# Patient Record
Sex: Male | Born: 1980 | Race: White | Marital: Single | Smoking: Current some day smoker
Health system: Southern US, Community
[De-identification: ages and names within clinical notes are randomized; demographics above are authoritative.]

---

## 2013-09-27 ENCOUNTER — Emergency Department (HOSPITAL_COMMUNITY): Payer: Self-pay

## 2013-09-27 ENCOUNTER — Emergency Department (HOSPITAL_COMMUNITY)
Admission: EM | Admit: 2013-09-27 | Discharge: 2013-09-27 | Disposition: A | Discharge status: Home / Self Care | Payer: Self-pay | Attending: Emergency Medicine | Admitting: Emergency Medicine

## 2013-09-27 ENCOUNTER — Encounter (HOSPITAL_COMMUNITY): Payer: Self-pay | Admitting: Emergency Medicine

## 2013-09-27 DIAGNOSIS — S0121XA Laceration without foreign body of nose, initial encounter: Secondary | ICD-10-CM

## 2013-09-27 DIAGNOSIS — Z23 Encounter for immunization: Secondary | ICD-10-CM | POA: Insufficient documentation

## 2013-09-27 DIAGNOSIS — S298XXA Other specified injuries of thorax, initial encounter: Secondary | ICD-10-CM | POA: Insufficient documentation

## 2013-09-27 DIAGNOSIS — IMO0002 Reserved for concepts with insufficient information to code with codable children: Secondary | ICD-10-CM | POA: Insufficient documentation

## 2013-09-27 DIAGNOSIS — S0990XA Unspecified injury of head, initial encounter: Secondary | ICD-10-CM | POA: Insufficient documentation

## 2013-09-27 DIAGNOSIS — R0789 Other chest pain: Secondary | ICD-10-CM

## 2013-09-27 DIAGNOSIS — S0120XA Unspecified open wound of nose, initial encounter: Secondary | ICD-10-CM | POA: Insufficient documentation

## 2013-09-27 DIAGNOSIS — F17200 Nicotine dependence, unspecified, uncomplicated: Secondary | ICD-10-CM | POA: Insufficient documentation

## 2013-09-27 MED ORDER — ACETAMINOPHEN 325 MG PO TABS
650.0000 mg | ORAL_TABLET | Freq: Once | ORAL | Status: AC
  Administered 2013-09-27: 650 mg via ORAL
  Filled 2013-09-27: qty 2

## 2013-09-27 MED ORDER — TETANUS-DIPHTH-ACELL PERTUSSIS 5-2.5-18.5 LF-MCG/0.5 IM SUSP
0.5000 mL | Freq: Once | INTRAMUSCULAR | Status: AC
  Administered 2013-09-27: 0.5 mL via INTRAMUSCULAR
  Filled 2013-09-27: qty 0.5

## 2013-09-27 MED ORDER — LIDOCAINE HCL 1 % IJ SOLN
5.0000 mL | Freq: Once | INTRAMUSCULAR | Status: AC
  Administered 2013-09-27: 5 mL
  Filled 2013-09-27: qty 20

## 2013-09-27 MED ORDER — IBUPROFEN 400 MG PO TABS: 400.0000 mg | ORAL_TABLET | Freq: Four times a day (QID) | ORAL | Status: AC | PRN

## 2013-09-27 MED ORDER — HYDROCODONE-ACETAMINOPHEN 5-325 MG PO TABS: 1.0000 | ORAL_TABLET | Freq: Four times a day (QID) | ORAL | Status: DC | PRN

## 2013-09-27 MED ORDER — PENICILLIN V POTASSIUM 250 MG PO TABS: 500.0000 mg | ORAL_TABLET | Freq: Four times a day (QID) | ORAL | Status: AC

## 2013-09-27 NOTE — ED Notes (Signed)
Pt's face cleaned w/ NS
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
## 2013-09-27 NOTE — Progress Notes (Signed)
[facility] Community Liaison Stacy, ° °Provided pt with a list of primary care resources to help patient establish a pcp.  °
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
## 2013-09-27 NOTE — ED Notes (Signed)
Pt tolerating po, ambulated well independently.

## 2013-09-27 NOTE — Discharge Instructions (Signed)
Please call your doctor for a followup appointment within 24-48 hours. When you talk to your doctor please let them know that you were seen in the emergency department and have them acquire all of your records so that they can discuss the findings with you and formulate a treatment plan to fully care for your new and ongoing problems. Please call and set-up an appointment with [facility] Please call and set-up an appointment with Orthopedics Please rest and stay hydrated  Please avoid any physical or strenuous activity Please take medications as prescribed - while on pain medications there is to be no drinking alcohol, driving, operating any heavy machinery. If extra please dispose in a proper manner. Please do not take any extra Tylenol with this medication for this can lead to Tylenol overdose and liver issues.  Please apply ice to face to aid in swelling control Please have sutures removed in no more than 5 days Please continue to monitor symptoms closely and if symptoms are to worsen or change (fever greater than 101, chills, sweating, nausea, vomiting, chest pain, shortness of breathe, difficulty breathing, weakness, numbness, tingling, worsening or changes to pain pattern, facial swelling, fall, injury, headache, dizziness, blurred vision, sudden loss of vision, neck pain, neck stiffness) please report back to the Emergency Department immediately.   Por favor llame a su mdico para una cita de seguimiento dentro de las 24-48 horas. Cuando hable con su doctor favor, hgales saber que usted fue visto en el departamento de emergencia y hacer que adquieren todos sus registros para que puedan discutir los Kimberly con usted y formular un plan de tratamiento para atender plenamente a sus nuevas y Arts administrator. Por favor llame y [place] en marcha de una cita con el Bethesda y [place] Por favor llame y puesta en marcha una cita con Ortopedia Por favor, descansar y  mantenerse hidratado Por favor, evite cualquier actividad fsica extenuante o Por favor, tome medicamentos prescritos - mientras que en medicamentos para el dolor va a haber nada de alcohol para beber, conducir, operar maquinaria pesada. Si adicional por favor disponer de Consolidated Edison. Por favor, no tome ninguna Tylenol extra con este medicamento para esto puede conducir a problemas de sobredosis de Tylenol y English as a second language teacher. Por favor, aplique hielo para hacer frente a la ayuda en el control de la inflamacin Por favor tenga suturas eliminado en no ms de 5 [address] favor, seguir vigilando los sntomas de cerca y si los sntomas son a Theme park manager o cambio (fiebre de ms de 101, escalofros, sudoracin, nuseas, vmitos, dolor en el pecho, dificultad para respirar, dificultad para respirar, debilidad, entumecimiento, hormigueo, empeoramiento o cambios en el patrn del dolor , hinchazn de la cara, cada, lesin, dolor de cabeza, mareos, visin borrosa, prdida repentina de la visin, dolor de cuello, rigidez del cuello), por favor informe al [employer] de Urgencias de inmediato.  Cuidados de Hotel manager - Adultos  (Laceration Care, Adult)  Una herida cortante es un corte o lesin que atraviesa todas las capas de la piel y el tejido que se encuentra debajo de la piel.  TRATAMIENTO  Algunas laceraciones no requieren sutura. Algunas no deben cerrarse debido a que puede aumentar el riesgo de infeccin. Es importante que consulte al mdico lo antes posible despus de recibir una lesin para minimizar el riesgo de infeccin y aumentar la posibilidad de que se cierre con xito.  Cuando se cierra adecuadamente, podrn indicarle analgsicos, si los necesita. La herida debe limpiarse  para combatir la infeccin. El mdico usar puntos (suturas), [place], o tiras Pointe a la [place]ache para Environmental consultant. Estos elementos mantendrn unidos los bordes de la piel para que se cure ms rpidamente y para un mejor resultado  cosmtico. Sin embargo, todas las heridas se curarn con una cicatriz. Una vez que la herida se haya curado, las cicatrices pueden minimizarse cubriendo la herida con pantalla solar durante el da por un lapso se 1 ao.  INSTRUCCIONES PARA EL CUIDADO EN EL HOGAR  Si tiene puntos o grapas:   Mantenga la herida limpia y [place].  Si tiene un (vendaje) cmbielo al menos una vez al da. Cmbielo si se moja o se ensucia, o segn las indicaciones del mdico.  Lave el corte dos veces por da con agua y [place]. Enjuguelo con agua para quitar todo el [place]. Seque dando palmaditas con una toalla limpia y seca.  Despus de limpiar, aplique una delgada capa de una crema con antibitico segn las indicaciones del mdico. Esto le ayudar a prevenir las infecciones y a Automotive engineer que el vendaje se Building services engineer.  Puede ducharse despus de las primeras 24 horas. No remoje la herida en agua hasta que le hayan quitado los puntos.  Solo tome medicamentos que se pueden comprar sin receta o recetados para Chief Technology Officer, Dentist o fiebre, como le indica el mdico.  Concurra para que le retiren los puntos o las grapas cuando el mdico le indique. En caso que tenga tiras ZOXWRUEAV:   Mantenga la herida limpia y seca.  No deje que las tiras se mojen. Puede darse un bao cuidando de [employer] Energy herida seca.  Si se moja, squela dando palmaditas con una toalla limpia.  Las tiras caern por s mismas. Puede recortar las tiras a medida que la herida se [place]. No quite las tiras que estn pegadas a la herida. Ellas se caern cuando sea el momento. En caso que le hayan [place].   Podr mojara momentneamente la herida en la ducha o el bao. No frote ni sumerja la herida. No practique natacin. Evite transpirar con abundancia hasta que el [place] se haya cado. Despus de ducharse o darse un bao, seque el corte dando palmaditas con una toalla limpia.  No aplique medicamentos lquidos, en crema o ungentos mientras el  QUALCOMM est en su lugar. Podr aflojarlo antes de que la herida se cure.  Si tiene un vendaje, tenga cuidado de no aplicar cinta adhesiva directamente Lehman Brothers. Esto puede hacer que el [place] se caiga antes de que la herida se haya curado.  Evite la exposicin prolongada a la luz del sol o a la International aid/development worker en QUALCOMM se Arts administrator. La exposicin a los Hydrographic surveyor la Training and development officer.  El Eastman Kodak la piel durante 5 a 10 das y Therapist, occupational. No quite la pelcula de [place]. Deber aplicarse la vacuna contra el ttanos si:  No recuerda cundo se coloc la vacuna la ltima vez.  Nunca recibi esta vacuna. Si le han aplicado la vacuna contra el ttanos, el brazo podr hincharse, enrojecer y sentirse caliente al tacto. Esto es frecuente y no es un problema. Si usted necesita aplicarse la vacuna y se niega a recibirla, corre riesgo de contraer ttanos. sta es una enfermedad grave.  SOLICITE ATENCIN MDICA SI:   Presenta enrojecimiento, hinchazn o aumento del dolor en la herida.  Hay rayas rojas que salen de la herida.  Observa un lquido blanco amarillento (  pus) en la herida.  Tiene fiebre.  Advierte un olor ftido que proviene de la herida o del vendaje.  La herida se abre luego de que le han extrado las suturas.  Nota que en la herida hay algn cuerpo extrao como un trozo de [place] o vidrio.  La herida est en su mano o pie y observa que no puede mover correctamente los dedos. SOLICITE ATENCIN MDICA DE INMEDIATO SI:   El dolor no se alivia con los [employer] Parcel.  Hay una zona muy hinchada alrededor de la herida que le causa dolor y adormecimiento, o advierte un cambio en el color en el brazo, la mano, la pierna o el pie.  La herida se abre y sangra nuevamente.  Siente que el adormecimiento, la debilidad o la prdida de la funcin de la articulacin que rodea la herida  Mantua.  Palpa ndulos dolorosos cerca de la herida o bajo la piel en cualquier zona del cuerpo. ASEGRESE DE QUE:   Comprende estas instrucciones.  Controlar su enfermedad.  Solicitar ayuda de inmediato si no mejora o si empeora. Document Released: 01/03/2005 Document Revised: 03/28/2011 [facility] Patient Information 2015 [place], [place]. This information is not intended to replace advice given to you by your health care provider. Make sure you discuss any questions you have with your health care provider.  Dolor de la pared torcica (Chest Wall Pain) Dolor en la pared torcica es dolor en o alrededor de los huesos y msculos de su pecho. Podrn pasar hasta 6 semanas hasta que comience a mejorar. Puede demorar ms tiempo si es fsicamente activo en su Aleen Campi y [place].  CAUSAS  El dolor en el pecho puede aparecer sin motivo. No obstante, algunas causas pueden ser:   Neomia Dear enfermedad viral como la gripe.  Traumatismos.  Tos.  La prctica de ejercicios.  Artritis.  Fibromialgia  Culebrilla. INSTRUCCIONES PARA EL CUIDADO DOMICILIARIO  Evite hacer actividad fsica extenuante. Trate de no esforzarse o Electrical engineer. Aqu se incluyen las actividades en las que [place] los msculos del trax, los abdominales y los msculos laterales, especialmente si debe levantar objetos pesados.  Aplique hielo sobre la zona dolorida.  Ponga el hielo en una bolsa plstica.  Colquese una toalla entre la piel y la bolsa de hielo.  Deje la bolsa de hielo durante 15 a 20 minutos por hora, durante los primeros 2 [address].  Utilice los medicamentos de venta libre o de prescripcin para Chief Technology Officer, Environmental health practitioner o la [place], segn se lo indique el profesional que lo asiste. SOLICITE ATENCIN MDICA DE INMEDIATO SI:  El dolor aumenta o siente muchas molestias.  Tiene fiebre.  El dolor de [place].  Desarrolla nuevos e inexplicables sntomas.  Tiene nuseas o  vmitos.  Berenice Primas o se siente mareado.  Tiene tos con flema (esputo), o tose con sangre. EST SEGURO QUE:   Comprende las instrucciones para el alta mdica.  Controlar su enfermedad.  Solicitar atencin mdica de inmediato segn las indicaciones. Document Released: 02/14/2006 Document Revised: 03/28/2011 [facility] Patient Information 2015 [place], [place]. This information is not intended to replace advice given to you by your health care provider. Make sure you discuss any questions you have with your health care provider.   Emergency Department Resource Guide 1) Find a Doctor and Pay Out of Pocket Although you won't have to find out who is covered by your insurance plan, it is a good idea to ask around and get recommendations. You will then need to  call the office and see if the doctor you have chosen will accept you as a new patient and what types of options they offer for patients who are self-pay. Some doctors offer discounts or will set up payment plans for their patients who do not have insurance, but you will need to ask so you aren't surprised when you get to your appointment.  2) Contact Your Local Health Department Not all health departments have doctors that can see patients for sick visits, but many do, so it is worth a call to see if yours does. If you don't know where your local health department is, you can check in your phone book. The CDC also has a tool to help you locate your state's health department, and many state websites also have listings of all of their local health departments.  3) Find a Walk-in Clinic If your illness is not likely to be very severe or complicated, you may want to try a walk in clinic. These are popping up all over the country in pharmacies, drugstores, and shopping centers. They're usually staffed by nurse practitioners or physician assistants that have been trained to treat common illnesses and complaints. They're usually fairly quick and  inexpensive. However, if you have serious medical issues or chronic medical problems, these are probably not your best option.  No Primary Care Doctor: - Call Health Connect at  [phone] - they can help you locate a primary care doctor that  accepts your insurance, provides certain services, etc. - Physician Referral Service- [phone]  Chronic Pain Problems: Organization         Address  Phone   Notes  [facility]  [phone] Patients need to be referred by their primary care doctor.   Medication Assistance: Organization         Address  Phone   Notes  [facility] Medication [facility] [address] [place] [place], [place] [address] [phone] --Must be a resident of [facility] -- Must have NO insurance coverage whatsoever (no Medicaid/ Medicare, etc.) -- The pt. MUST have a primary care doctor that directs their care regularly and follows them in the community   MedAssist  [phone]   [employer]  [phone]    Agencies that provide inexpensive medical care: Organization         Address  Phone   Notes  [facility]  [phone]   [facility]    [phone]   [facility] [address] [place], [place] [address] [phone]   [facility] [address] [place] [place] [phone]   [facility]anned Parenthood    [phone]   [facility]    [phone]   [facility]  [address], [place] Phone:  [phone], Fax:  [phone] Hours of Operation:  9 am - 6 pm, M-F.  Also accepts Medicaid/Medicare and self-pay.  [facility]  [address], [place] Phone: [phone], Fax: [phone]. Hours of Operation:  8:30 am - 5:30 pm, M-F.  Also accepts Medicaid and self-pay.  [facility] [place] [address] [place] Phone: [phone]   [facility] [address] Natasha Bence [place], [place] [phone], Ext. [phone] Mondays & Thursdays: 7-9 AM.  First 15 patients are seen on a first come, first serve basis.  Medicaid-accepting [facility] Providers:  Organization         Address  Phone   Notes  [facility] [address], [place] [phone] Also accepts self-pay patients.  [facility] [address] Laurell Josephs [place], [place]  [phone]   [facility] [address], [place] [phone]   [facility] [address], [place] [phone]   Renaye Rakers [address] [place], [place]   [phone] Only accepts [place] Access [place] patients after they have their name applied to their card.   Self-Pay (no insurance) in [facility]:  Organization         Address  Phone   Notes  Sickle Cell Patients, [facility] [address] [place], [place] [phone]   [facility] Care [address] [place], [place] [phone]   [facility] [place]  [address], [place] [phone]   [facility]/Dr. Osei-Bonsu  [address], [place] or [address], [place] [phone] Phone number for both [place] and [place] locations is the same.  [facility] and [facility] [address] [place] [phone]   [facility] [address] [place] or [address] [phone]   [facility] [address] [place] [phone], phone; [phone], fax Sees patients 1st and 3rd Saturday of every month.  Must not qualify for public or private insurance (i.e. Medicaid, Medicare, [place]vay Health Choice, Veterans' Benefits)  Household income should be no more than 200% of the poverty level The clinic cannot treat you if you are pregnant or  think you are pregnant  Sexually transmitted diseases are not treated at the clinic.    Dental Care: Organization         Address  Phone  Notes  [facility] [address] [place], [place] [phone] Accepts children up to age 68 who are enrolled in [place] or [place] Health Choice; pregnant women with a Medicaid card; and children who have applied for Medicaid or [place] Health Choice, but were declined, whose parents can pay a reduced fee at time of service.  [facility] Department of [facility]  [address], [place] [phone] Accepts children up to age 85 who are enrolled in [place] or  Health Choice; pregnant women with a Medicaid card; and children who have applied for Medicaid or  Health Choice, but were declined, whose parents can pay a reduced fee at time of service.  [facility]  [address] [place], [place] [phone] Patients are seen by appointment only. Walk-ins are not accepted. [facility] will see patients 57 years of age and older. Monday - Tuesday (8am-5pm) Most Wednesdays (8:30-5pm) $30 per visit, cash only  [facility] Adult Dental Access [facility]  [address], [facility] [phone] Patients are seen by appointment only. Walk-ins are not accepted. [facility] will see patients 59 years of age and older. One Wednesday Evening (Monthly: Volunteer Based).  $30 per visit, cash only  [employer]  [phone] for adults; Children under age 29, call Graduate Pediatric Dentistry at [phone]. Children aged 70-14, please call [phone] to request a pediatric  application.  Dental services are provided in all areas of dental care including fillings, crowns and bridges, complete and partial dentures, implants, gum treatment, root canals, and extractions. Preventive care is also provided. Treatment is provided to both adults  and children. Patients are selected via a lottery and there is often a waiting list.   [facility] [address] [place]  [phone] [email or web address]   [facility] [address] [place], [place] [phone], Ext. [phone] Second and Fourth Thursday of each month, opens at 6:30 AM; Clinic ends at 9 AM.  Patients are seen on a first-come first-served basis, and a limited number are seen during each clinic.   [facility]  [address] Ether Griffins [place], [place] [phone]   Eligibility Requirements You must have lived in [place], [place], or [place] counties for at least the last three months.   You cannot be eligible for state or federal sponsored [place], including [employer], [place], or [employer].   You generally cannot be eligible for healthcare insurance through your employer.    How to apply: Eligibility screenings are held every Tuesday and Wednesday afternoon from 1:00 pm until 4:00 pm. You do not need an appointment for the interview!  [facility] [address], [place], [place] [phone]   [facility] Health Department  [phone]   [facility] Health Department  [phone]   [facility] Health Department  [phone]    Behavioral Health Resources in the Community: Intensive Outpatient Programs Organization         Address  Phone  Notes  [facility] [address], [place], [place] [phone]   [facility] Outpatient [address] [place], [place] [phone]   [facility] [address] [place], [place]  [phone]   [facility] Mental Health [address],  [place], [place] [phone] or [phone]   Substance Abuse Resources Organization         Address  Phone  Notes  [facility] Drug [facility]  [phone]   [facility]  [phone]   [facility] [place]  [phone]    Floydene Flock  [phone]   Residential & Outpatient Substance Abuse Program  [phone]   Psychological Services Organization         Address  Phone  Notes  [facility]  [phone]   [facility] Services  [phone]   [facility] Mental Health [address], [place] [phone] or [phone]    Mobile Crisis Teams Organization         Address  Phone  Notes  [facility], Mobile Crisis Care Unit  [phone]   [facility]  [address] [place], [place] [phone]   Doristine Locks [address] [place] [phone]    Self-Help/Support Groups Organization         Address  Phone             Notes  [facility] [place] - variety of support groups  [phone]- I7437963 Call for more information  Narcotics Anonymous (NA), [facility] [address], [employer] [place]  2 meetings at this location   Statistician         Address  Phone  Notes  [facility] [address],    [place]  [phone]   [facility]  [address], [place] [id number],  [place], [place] [phone]   [facility] Residential Treatment Facility [address] [place], [place] [phone] Admissions: 8am-3pm M-F  [facility] [address]    [place], [place] [phone]   [facility] [address] [place], [place], [place] [phone]   [facility] [address],  [place], [place] [phone]   [facility] - Intensive Outpatient [address] Laurell Josephs [address], [place], [place] [phone]   [facility] ([facility]iction [facility] [address] [place] [address]  [place], [place] [phone] or [phone]   [facility] ([facility]) [address], [place], [place] [phone] Accepts Medicaid  [facility] [address],  [place] [phone] Substance Abuse/Addiction Treatment   [facility] Organization         Address  Phone  Notes  [facility]  [phone]   Angie Fava, PhD [address] Ervin Knack [place], [place]   [phone] or [phone]   [facility] Behavioral   [address] [place], [place] [phone]   [facility] [address] [place], [place] [phone] Insurance/Medicaid/sponsorship through [facility] and Families [address]                                    [place], [place] [phone] Therapy/tele-psych/case  [facility] [address], [place] [phone]    Dr. Lolly Mustache  [phone]   [facility]  [employer] [facility] Dept. 1) [address] [place] 2) [address], [place] 3)  [address], [place] [phone]  [phone]   [facility] Child Abuse Hotline [phone]) [phone] or [phone] (After Hours)

## 2013-10-02 ENCOUNTER — Emergency Department (HOSPITAL_COMMUNITY)
Admission: EM | Admit: 2013-10-02 | Discharge: 2013-10-03 | Disposition: A | Discharge status: Home / Self Care | Payer: Self-pay | Attending: Emergency Medicine | Admitting: Emergency Medicine

## 2013-10-02 ENCOUNTER — Encounter (HOSPITAL_COMMUNITY): Payer: Self-pay | Admitting: Emergency Medicine

## 2013-10-02 DIAGNOSIS — Z792 Long term (current) use of antibiotics: Secondary | ICD-10-CM | POA: Insufficient documentation

## 2013-10-02 DIAGNOSIS — Z4802 Encounter for removal of sutures: Secondary | ICD-10-CM | POA: Insufficient documentation

## 2013-10-02 DIAGNOSIS — F17200 Nicotine dependence, unspecified, uncomplicated: Secondary | ICD-10-CM | POA: Insufficient documentation

## 2013-10-02 NOTE — ED Notes (Signed)
Bed: WTR5 Expected date:  Expected time:  Means of arrival:  Comments: 
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
## 2013-10-02 NOTE — Discharge Instructions (Signed)
Remoción de la sutura, cuidados posteriores °(Suture Removal, Care After) °Siga estas instrucciones durante las próximas semanas. Estas indicaciones le proporcionan información general acerca de cómo deberá cuidarse después del procedimiento. El médico también podrá darle instrucciones más específicas. El tratamiento se ha planificado de acuerdo a las prácticas médicas actuales, pero a veces se producen problemas. Comuníquese con el médico si tiene algún problema o tiene dudas después del procedimiento. °QUÉ ESPERAR DESPUÉS DEL PROCEDIMIENTO °Después de que le retiren los puntos (suturas), es normal experimentar lo siguiente: °· Molestias e hinchazón en la zona de la herida. °· Leve enrojecimiento en la zona de la herida. °INSTRUCCIONES PARA EL CUIDADO EN EL HOGAR  °· Si tiene bandas adhesivas en la piel sobre la zona de la herida, no las retire. Se caerán solas en unos pocos días. Si las bandas adhesivas siguen en su lugar después de 14 días, entonces puede retirarlas. °· Cambie los apósitos (vendajes), al menos, una vez al día o según las instrucciones del médico. Si el vendaje se adhiere, remójelo con agua jabonosa tibia. °· Solo aplique un ungüento o crema según las indicaciones del médico. Si usa crema o ungüento, lave la zona con agua y jabón dos veces por día para quitarlo todo. Enjuague el jabón y seque suavemente la zona con una toalla limpia. °· Mantenga la herida limpia y seca. Si el vendaje se moja, se ensucia o tiene mal olor, cámbielo tan pronto como pueda. °· Continúe protegiendo la herida de lesiones. °· Use protector solar cuando salga al sol. Las cicatrices nuevas se broncean fácilmente. °SOLICITE ATENCIÓN MÉDICA SI: °· Aumenta el enrojecimiento, la hinchazón o el dolor en la zona afectada. °· Observa que sale pus de la herida. °· Tiene fiebre. °· Advierte un olor fétido que proviene de la herida o del vendaje. °· La herida se abre (los bordes se separan). °Document Released: 10/13/2004 Document  Revised: 10/24/2012 °ExitCare® Patient Information ©2015 [employer]. This information is not intended to replace advice given to you by your health care provider. Make sure you discuss any questions you have with your health care provider. ° °

## 2013-10-02 NOTE — ED Notes (Signed)
Pt here for suture removal on bridge of his nose.  Wound appears to be healing well.  No drainage noted at this time.

## 2013-10-03 NOTE — ED Provider Notes (Signed)
Medical screening examination/treatment/procedure(s) were performed by non-physician practitioner and as supervising physician I was immediately available for consultation/collaboration.   EKG Interpretation None       Pricila Bridge K Renley Banwart-Rasch, MD 10/03/13 1610

## 2013-10-03 NOTE — ED Notes (Signed)
See triage note.

## 2013-10-04 NOTE — ED Provider Notes (Signed)
Medical screening examination/treatment/procedure(s) were performed by non-physician practitioner and as supervising physician I was immediately available for consultation/collaboration.   EKG Interpretation None        Candyce Churn III, MD 10/04/13 1525
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
## 2022-01-16 ENCOUNTER — Emergency Department (HOSPITAL_COMMUNITY): Payer: Self-pay

## 2022-01-16 ENCOUNTER — Other Ambulatory Visit: Payer: Self-pay

## 2022-01-16 ENCOUNTER — Encounter (HOSPITAL_COMMUNITY): Payer: Self-pay | Admitting: *Deleted

## 2022-01-16 ENCOUNTER — Emergency Department (HOSPITAL_COMMUNITY)
Admission: EM | Admit: 2022-01-16 | Discharge: 2022-01-16 | Disposition: A | Discharge status: Home / Self Care | Payer: Self-pay | Attending: Emergency Medicine | Admitting: Emergency Medicine

## 2022-01-16 DIAGNOSIS — S82892A Other fracture of left lower leg, initial encounter for closed fracture: Secondary | ICD-10-CM

## 2022-01-16 DIAGNOSIS — Y9372 Activity, wrestling: Secondary | ICD-10-CM | POA: Insufficient documentation

## 2022-01-16 DIAGNOSIS — S82852A Displaced trimalleolar fracture of left lower leg, initial encounter for closed fracture: Secondary | ICD-10-CM | POA: Insufficient documentation

## 2022-01-16 MED ORDER — PROPOFOL 10 MG/ML IV BOLUS
1.0000 mg/kg | Freq: Once | INTRAVENOUS | Status: DC
  Filled 2022-01-16: qty 20

## 2022-01-16 MED ORDER — LACTATED RINGERS IV BOLUS
1000.0000 mL | Freq: Once | INTRAVENOUS | Status: AC
  Administered 2022-01-16: 1000 mL via INTRAVENOUS

## 2022-01-16 MED ORDER — LIDOCAINE-EPINEPHRINE (PF) 2 %-1:200000 IJ SOLN
20.0000 mL | Freq: Once | INTRAMUSCULAR | Status: AC
  Administered 2022-01-16: 20 mL via INTRADERMAL
  Filled 2022-01-16: qty 20

## 2022-01-16 MED ORDER — OXYCODONE-ACETAMINOPHEN 5-325 MG PO TABS
1.0000 | ORAL_TABLET | Freq: Once | ORAL | Status: AC
  Administered 2022-01-16: 1 via ORAL
  Filled 2022-01-16: qty 1

## 2022-01-16 MED ORDER — OXYCODONE HCL 5 MG PO TABS
5.0000 mg | ORAL_TABLET | Freq: Once | ORAL | Status: AC
  Administered 2022-01-16: 5 mg via ORAL
  Filled 2022-01-16: qty 1

## 2022-01-16 MED ORDER — ONDANSETRON HCL 4 MG/2ML IJ SOLN
4.0000 mg | Freq: Once | INTRAMUSCULAR | Status: AC
  Administered 2022-01-16: 4 mg via INTRAVENOUS
  Filled 2022-01-16: qty 2

## 2022-01-16 MED ORDER — PROPOFOL 10 MG/ML IV BOLUS
INTRAVENOUS | Status: AC | PRN
  Administered 2022-01-16: 30 mg via INTRAVENOUS

## 2022-01-16 MED ORDER — FENTANYL CITRATE PF 50 MCG/ML IJ SOSY
100.0000 ug | PREFILLED_SYRINGE | Freq: Once | INTRAMUSCULAR | Status: AC
  Administered 2022-01-16: 100 ug via INTRAVENOUS
  Filled 2022-01-16: qty 2
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
## 2022-01-24 ENCOUNTER — Ambulatory Visit: Payer: Self-pay | Admitting: Student

## 2022-01-24 DIAGNOSIS — S82892A Other fracture of left lower leg, initial encounter for closed fracture: Secondary | ICD-10-CM

## 2022-01-25 ENCOUNTER — Other Ambulatory Visit: Payer: Self-pay

## 2022-01-25 ENCOUNTER — Encounter (HOSPITAL_COMMUNITY): Payer: Self-pay | Admitting: Student

## 2022-01-25 NOTE — Progress Notes (Signed)
SDW CALL  Patient was given pre-op instructions over the phone. Patient verbalized understanding of instructions provided. Administrator, sports with [employer], [phone], was used during the phone call   PCP - No PCP Cardiologist - denies  Chest x-ray - n/a EKG - n/a Stress Test - denies  ECHO -  denies Cardiac Cath - denies  Blood Thinner Instructions: No medicines Aspirin Instructions:  ERAS Protcol - NPO PRE-SURGERY Ensure or G2-   COVID TEST- no   Anesthesia review: no  Patient denies shortness of breath, fever, cough and chest pain over the phone call
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
## 2022-01-26 ENCOUNTER — Other Ambulatory Visit: Payer: Self-pay

## 2022-01-26 ENCOUNTER — Ambulatory Visit (HOSPITAL_COMMUNITY): Payer: Self-pay | Admitting: Certified Registered Nurse Anesthetist

## 2022-01-26 ENCOUNTER — Ambulatory Visit (HOSPITAL_BASED_OUTPATIENT_CLINIC_OR_DEPARTMENT_OTHER): Payer: Self-pay | Admitting: Certified Registered Nurse Anesthetist

## 2022-01-26 ENCOUNTER — Ambulatory Visit (HOSPITAL_COMMUNITY): Payer: Self-pay

## 2022-01-26 ENCOUNTER — Ambulatory Visit (HOSPITAL_COMMUNITY)
Admission: RE | Admit: 2022-01-26 | Discharge: 2022-01-26 | Disposition: A | Discharge status: Home / Self Care | Payer: Self-pay | Attending: Student | Admitting: Student

## 2022-01-26 ENCOUNTER — Encounter (HOSPITAL_COMMUNITY)
Admission: RE | Disposition: A | Discharge status: Home / Self Care | Payer: Self-pay | Source: Home / Self Care | Attending: Student

## 2022-01-26 ENCOUNTER — Encounter (HOSPITAL_COMMUNITY): Payer: Self-pay | Admitting: Student

## 2022-01-26 DIAGNOSIS — F17210 Nicotine dependence, cigarettes, uncomplicated: Secondary | ICD-10-CM | POA: Insufficient documentation

## 2022-01-26 DIAGNOSIS — S82892A Other fracture of left lower leg, initial encounter for closed fracture: Secondary | ICD-10-CM

## 2022-01-26 DIAGNOSIS — S82852D Displaced trimalleolar fracture of left lower leg, subsequent encounter for closed fracture with routine healing: Secondary | ICD-10-CM | POA: Insufficient documentation

## 2022-01-26 DIAGNOSIS — S82892D Other fracture of left lower leg, subsequent encounter for closed fracture with routine healing: Secondary | ICD-10-CM

## 2022-01-26 HISTORY — PX: ORIF ANKLE FRACTURE: SHX5408

## 2022-01-26 LAB — RAPID URINE DRUG SCREEN, HOSP PERFORMED
Amphetamines: NOT DETECTED
Barbiturates: NOT DETECTED
Benzodiazepines: NOT DETECTED
Cocaine: NOT DETECTED
Opiates: NOT DETECTED
Tetrahydrocannabinol: NOT DETECTED

## 2022-01-26 LAB — SURGICAL PCR SCREEN
MRSA, PCR: NEGATIVE
Staphylococcus aureus: POSITIVE — AB

## 2022-01-26 LAB — CBC WITH DIFFERENTIAL/PLATELET
Abs Immature Granulocytes: 0.02 K/uL (ref 0.00–0.07)
Basophils Absolute: 0 K/uL (ref 0.0–0.1)
Basophils Relative: 0 %
Eosinophils Absolute: 0.1 K/uL (ref 0.0–0.5)
Eosinophils Relative: 1 %
HCT: 37.3 % — ABNORMAL LOW (ref 39.0–52.0)
Hemoglobin: 13 g/dL (ref 13.0–17.0)
Immature Granulocytes: 0 %
Lymphocytes Relative: 25 %
Lymphs Abs: 1.6 K/uL (ref 0.7–4.0)
MCH: 33.1 pg (ref 26.0–34.0)
MCHC: 34.9 g/dL (ref 30.0–36.0)
MCV: 94.9 fL (ref 80.0–100.0)
Monocytes Absolute: 0.7 K/uL (ref 0.1–1.0)
Monocytes Relative: 11 %
Neutro Abs: 4.2 K/uL (ref 1.7–7.7)
Neutrophils Relative %: 63 %
Platelets: 399 K/uL (ref 150–400)
RBC: 3.93 MIL/uL — ABNORMAL LOW (ref 4.22–5.81)
RDW: 12.4 % (ref 11.5–15.5)
WBC: 6.7 K/uL (ref 4.0–10.5)
nRBC: 0 % (ref 0.0–0.2)

## 2022-01-26 LAB — HEMOGLOBIN A1C
Hgb A1c MFr Bld: 4.9 % (ref 4.8–5.6)
Mean Plasma Glucose: 93.93 mg/dL

## 2022-01-26 LAB — BASIC METABOLIC PANEL
Anion gap: 9 (ref 5–15)
BUN: 13 mg/dL (ref 6–20)
CO2: 24 mmol/L (ref 22–32)
Calcium: 9 mg/dL (ref 8.9–10.3)
Chloride: 102 mmol/L (ref 98–111)
Creatinine, Ser: 0.72 mg/dL (ref 0.61–1.24)
GFR, Estimated: >60 mL/min (ref >60)
Glucose, Bld: 85 mg/dL (ref 70–99)
Potassium: 3.5 mmol/L (ref 3.5–5.1)
Sodium: 135 mmol/L (ref 135–145)

## 2022-01-26 SURGERY — OPEN REDUCTION INTERNAL FIXATION (ORIF) ANKLE FRACTURE
Anesthesia: Regional | Site: Ankle | Laterality: Left

## 2022-01-26 MED ORDER — CLONIDINE HCL (ANALGESIA) 100 MCG/ML EP SOLN
EPIDURAL | Status: DC | PRN
  Administered 2022-01-26: 100 ug

## 2022-01-26 MED ORDER — BUPIVACAINE-EPINEPHRINE (PF) 0.5% -1:200000 IJ SOLN
INTRAMUSCULAR | Status: DC | PRN
  Administered 2022-01-26: 25 mL via PERINEURAL

## 2022-01-26 MED ORDER — MIDAZOLAM HCL 5 MG/5ML IJ SOLN
INTRAMUSCULAR | Status: DC | PRN
  Administered 2022-01-26 (×3): 1 mg via INTRAVENOUS

## 2022-01-26 MED ORDER — PROPOFOL 10 MG/ML IV BOLUS
INTRAVENOUS | Status: DC | PRN
  Administered 2022-01-26: 100 mg via INTRAVENOUS
  Administered 2022-01-26: 20 mg via INTRAVENOUS

## 2022-01-26 MED ORDER — FENTANYL CITRATE (PF) 250 MCG/5ML IJ SOLN
INTRAMUSCULAR | Status: DC | PRN
  Administered 2022-01-26: 25 ug via INTRAVENOUS

## 2022-01-26 MED ORDER — PROPOFOL 10 MG/ML IV BOLUS
INTRAVENOUS | Status: AC
  Filled 2022-01-26: qty 20

## 2022-01-26 MED ORDER — GABAPENTIN 100 MG PO CAPS: 100.0000 mg | ORAL_CAPSULE | Freq: Three times a day (TID) | ORAL | 0 refills | Status: AC

## 2022-01-26 MED ORDER — OXYCODONE-ACETAMINOPHEN 5-325 MG PO TABS: 1.0000 | ORAL_TABLET | ORAL | 0 refills | Status: AC | PRN

## 2022-01-26 MED ORDER — OXYCODONE HCL 5 MG/5ML PO SOLN: 5.0000 mg | Freq: Once | ORAL | Status: DC | PRN

## 2022-01-26 MED ORDER — MIDAZOLAM HCL 2 MG/2ML IJ SOLN
INTRAMUSCULAR | Status: AC
  Filled 2022-01-26: qty 2

## 2022-01-26 MED ORDER — ROPIVACAINE HCL 7.5 MG/ML IJ SOLN
INTRAMUSCULAR | Status: DC | PRN
  Administered 2022-01-26: 10 mL via PERINEURAL

## 2022-01-26 MED ORDER — ASPIRIN 325 MG PO TBEC: 325.0000 mg | DELAYED_RELEASE_TABLET | Freq: Every day | ORAL | 0 refills | Status: AC

## 2022-01-26 MED ORDER — ONDANSETRON HCL 4 MG/2ML IJ SOLN
INTRAMUSCULAR | Status: DC | PRN
  Administered 2022-01-26: 4 mg via INTRAVENOUS

## 2022-01-26 MED ORDER — VANCOMYCIN HCL 1000 MG IV SOLR
INTRAVENOUS | Status: AC
  Filled 2022-01-26: qty 20

## 2022-01-26 MED ORDER — 0.9 % SODIUM CHLORIDE (POUR BTL) OPTIME
TOPICAL | Status: DC | PRN
  Administered 2022-01-26: 1000 mL

## 2022-01-26 MED ORDER — CHLORHEXIDINE GLUCONATE 0.12 % MT SOLN
OROMUCOSAL | Status: AC
  Administered 2022-01-26: 15 mL via OROMUCOSAL
  Filled 2022-01-26: qty 15

## 2022-01-26 MED ORDER — OXYCODONE HCL 5 MG PO TABS: 5.0000 mg | ORAL_TABLET | Freq: Once | ORAL | Status: DC | PRN

## 2022-01-26 MED ORDER — ACETAMINOPHEN 160 MG/5ML PO SOLN: 1000.0000 mg | Freq: Once | ORAL | Status: DC | PRN

## 2022-01-26 MED ORDER — LACTATED RINGERS IV SOLN: INTRAVENOUS | Status: DC

## 2022-01-26 MED ORDER — PHENYLEPHRINE HCL-NACL 20-0.9 MG/250ML-% IV SOLN
INTRAVENOUS | Status: DC | PRN
  Administered 2022-01-26: 25 ug/min via INTRAVENOUS

## 2022-01-26 MED ORDER — CHLORHEXIDINE GLUCONATE 0.12 % MT SOLN: 15.0000 mL | Freq: Once | OROMUCOSAL | Status: AC

## 2022-01-26 MED ORDER — CEFAZOLIN SODIUM-DEXTROSE 2-4 GM/100ML-% IV SOLN
2.0000 g | INTRAVENOUS | Status: AC
  Administered 2022-01-26: 2 g via INTRAVENOUS
  Filled 2022-01-26: qty 100

## 2022-01-26 MED ORDER — VANCOMYCIN HCL 1000 MG IV SOLR
INTRAVENOUS | Status: DC | PRN
  Administered 2022-01-26: 1000 mg

## 2022-01-26 MED ORDER — METHOCARBAMOL 500 MG PO TABS: 500.0000 mg | ORAL_TABLET | Freq: Four times a day (QID) | ORAL | 0 refills | Status: AC | PRN

## 2022-01-26 MED ORDER — ACETAMINOPHEN 10 MG/ML IV SOLN: 1000.0000 mg | Freq: Once | INTRAVENOUS | Status: DC | PRN

## 2022-01-26 MED ORDER — LIDOCAINE-EPINEPHRINE 2 %-1:100000 IJ SOLN
INTRAMUSCULAR | Status: DC | PRN
  Administered 2022-01-26: 5 mL via PERINEURAL

## 2022-01-26 MED ORDER — FENTANYL CITRATE (PF) 250 MCG/5ML IJ SOLN
INTRAMUSCULAR | Status: AC
  Filled 2022-01-26: qty 5

## 2022-01-26 MED ORDER — FENTANYL CITRATE (PF) 100 MCG/2ML IJ SOLN: 25.0000 ug | INTRAMUSCULAR | Status: DC | PRN

## 2022-01-26 MED ORDER — ACETAMINOPHEN 500 MG PO TABS: 500.0000 mg | ORAL_TABLET | Freq: Four times a day (QID) | ORAL | 0 refills | Status: AC | PRN

## 2022-01-26 MED ORDER — PHENYLEPHRINE HCL (PRESSORS) 10 MG/ML IV SOLN
INTRAVENOUS | Status: DC | PRN
  Administered 2022-01-26: 160 ug via INTRAVENOUS
  Administered 2022-01-26: 80 ug via INTRAVENOUS
  Administered 2022-01-26: 160 ug via INTRAVENOUS

## 2022-01-26 MED ORDER — PROPOFOL 500 MG/50ML IV EMUL
INTRAVENOUS | Status: DC | PRN
  Administered 2022-01-26: 75 ug/kg/min via INTRAVENOUS

## 2022-01-26 MED ORDER — ACETAMINOPHEN 500 MG PO TABS: 1000.0000 mg | ORAL_TABLET | Freq: Once | ORAL | Status: DC | PRN

## 2022-01-26 MED ORDER — ORAL CARE MOUTH RINSE: 15.0000 mL | Freq: Once | OROMUCOSAL | Status: AC

## 2022-01-26 SURGICAL SUPPLY — 59 items
BAG COUNTER SPONGE SURGICOUNT (BAG) ×1 IMPLANT
BIT DRILL 2.7 QC CANN 155 (BIT) IMPLANT
BIT DRILL QC 2.5MM SHRT EVO SM (DRILL) IMPLANT
BNDG ELASTIC 3X5.8 VLCR STR LF (GAUZE/BANDAGES/DRESSINGS) IMPLANT
BNDG ELASTIC 6X5.8 VLCR STR LF (GAUZE/BANDAGES/DRESSINGS) IMPLANT
BRUSH SCRUB EZ PLAIN DRY (MISCELLANEOUS) ×2 IMPLANT
CHLORAPREP W/TINT 26 (MISCELLANEOUS) ×1 IMPLANT
COVER SURGICAL LIGHT HANDLE (MISCELLANEOUS) ×1 IMPLANT
DRAPE C-ARM 42X72 X-RAY (DRAPES) ×1 IMPLANT
DRAPE C-ARMOR (DRAPES) ×1 IMPLANT
DRAPE ORTHO SPLIT 77X108 STRL (DRAPES) ×1
DRAPE SURG ORHT 6 SPLT 77X108 (DRAPES) ×2 IMPLANT
DRAPE U-SHAPE 47X51 STRL (DRAPES) ×1 IMPLANT
DRILL QC 2.5MM SHORT EVOS SM (DRILL) ×1
DRSG MEPITEL 4X7.2 (GAUZE/BANDAGES/DRESSINGS) IMPLANT
ELECT REM PT RETURN 9FT ADLT (ELECTROSURGICAL) ×1
ELECTRODE REM PT RTRN 9FT ADLT (ELECTROSURGICAL) ×1 IMPLANT
GAUZE SPONGE 4X4 12PLY STRL (GAUZE/BANDAGES/DRESSINGS) IMPLANT
GLOVE BIO SURGEON STRL SZ 6.5 (GLOVE) ×3 IMPLANT
GLOVE BIO SURGEON STRL SZ7.5 (GLOVE) ×3 IMPLANT
GLOVE BIOGEL PI IND STRL 6.5 (GLOVE) ×1 IMPLANT
GLOVE BIOGEL PI IND STRL 7.5 (GLOVE) ×1 IMPLANT
GOWN STRL REUS W/ TWL LRG LVL3 (GOWN DISPOSABLE) ×2 IMPLANT
GOWN STRL REUS W/TWL LRG LVL3 (GOWN DISPOSABLE) ×2
K-WIRE 1.3X40 (WIRE) ×3
K-WIRE 1.6 (WIRE) ×1
K-WIRE FX150X1.6XTROC PNT (WIRE) ×1
KIT TURNOVER KIT B (KITS) ×1 IMPLANT
KWIRE 1.3X40 (WIRE) IMPLANT
KWIRE FX150X1.6XTROC PNT (WIRE) IMPLANT
MANIFOLD NEPTUNE II (INSTRUMENTS) ×1 IMPLANT
NS IRRIG 1000ML POUR BTL (IV SOLUTION) ×1 IMPLANT
PACK TOTAL JOINT (CUSTOM PROCEDURE TRAY) ×1 IMPLANT
PAD ARMBOARD 7.5X6 YLW CONV (MISCELLANEOUS) ×2 IMPLANT
PAD CAST 4YDX4 CTTN HI CHSV (CAST SUPPLIES) IMPLANT
PADDING CAST COTTON 4X4 STRL (CAST SUPPLIES) ×2
PADDING CAST COTTON 6X4 STRL (CAST SUPPLIES) IMPLANT
PLATE LOCK EVOS 3.5X94 8H (Plate) IMPLANT
SCREW CANN 4.0X28 (Screw) IMPLANT
SCREW CANNULATED 4.0X32 (Screw) IMPLANT
SCREW CANNULATED 4.0X34 (Screw) IMPLANT
SCREW CORT 3.5X11 ST EVOS (Screw) IMPLANT
SCREW CORT 3.5X13MM (Screw) IMPLANT
SCREW CORT EVOS ST 3.5X12 (Screw) IMPLANT
SCREW CORT ST EVOS 3.5X46 (Screw) IMPLANT
SPONGE T-LAP 18X18 ~~LOC~~+RFID (SPONGE) IMPLANT
STAPLER VISISTAT 35W (STAPLE) ×1 IMPLANT
SUCTION FRAZIER HANDLE 10FR (MISCELLANEOUS) ×1
SUCTION TUBE FRAZIER 10FR DISP (MISCELLANEOUS) ×1 IMPLANT
SUT ETHILON 3 0 PS 1 (SUTURE) ×2 IMPLANT
SUT PROLENE 0 CT (SUTURE) IMPLANT
SUT VIC AB 0 CT1 27 (SUTURE) ×1
SUT VIC AB 0 CT1 27XBRD ANBCTR (SUTURE) ×1 IMPLANT
SUT VIC AB 2-0 CT1 27 (SUTURE) ×2
SUT VIC AB 2-0 CT1 TAPERPNT 27 (SUTURE) ×2 IMPLANT
TOWEL GREEN STERILE (TOWEL DISPOSABLE) ×2 IMPLANT
TOWEL GREEN STERILE FF (TOWEL DISPOSABLE) ×1 IMPLANT
UNDERPAD 30X36 HEAVY ABSORB (UNDERPADS AND DIAPERS) ×1 IMPLANT
WATER STERILE IRR 1000ML POUR (IV SOLUTION) ×1 IMPLANT

## 2022-01-26 NOTE — Progress Notes (Signed)
Pt is in phase 2. Pt stable and VS WNL. Sitting up in W/C. No complaints of pain or discomfort. Pt uncle called. Discharge instructions explained . He states he will call once he is outside at the main entrance.
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
## 2022-01-26 NOTE — Anesthesia Procedure Notes (Signed)
Anesthesia Regional Block: Adductor canal block   Pre-Anesthetic Checklist: , timeout performed,  Correct Patient, Correct Site, Correct Laterality,  Correct Procedure, Correct Position, site marked,  Risks and benefits discussed,  Surgical consent,  Pre-op evaluation,  At surgeon's request and post-op pain management  Laterality: Right and Lower  Prep: chloraprep       Needles:  Injection technique: Single-shot      Needle Length: 9cm  Needle Gauge: 22     Additional Needles: Arrow StimuQuik ECHO Echogenic Stimulating PNB Needle  Procedures:,,,, ultrasound used (permanent image in chart),,    Narrative:  Start time: 01/26/2022 8:00 AM End time: 01/26/2022 8:06 AM Injection made incrementally with aspirations every 5 mL.  Performed by: Personally  Anesthesiologist: Oleta Mouse, MD

## 2022-01-26 NOTE — Anesthesia Procedure Notes (Signed)
Procedure Name: LMA Insertion Date/Time: 01/26/2022 9:03 AM  Performed by: Glynda Jaeger, CRNAPre-anesthesia Checklist: Patient identified, Emergency Drugs available, Suction available, Timeout performed and Patient being monitored Patient Re-evaluated:Patient Re-evaluated prior to induction Oxygen Delivery Method: Circle system utilized Preoxygenation: Pre-oxygenation with 100% oxygen Induction Type: IV induction Ventilation: Mask ventilation without difficulty LMA: LMA inserted LMA Size: 4.0 Number of attempts: 1 Tube secured with: Tape Dental Injury: Teeth and Oropharynx as per pre-operative assessment

## 2022-01-26 NOTE — Anesthesia Procedure Notes (Addendum)
Anesthesia Regional Block: Popliteal block   Pre-Anesthetic Checklist: , timeout performed,  Correct Patient, Correct Site, Correct Laterality,  Correct Procedure, Correct Position, site marked,  Risks and benefits discussed,  Surgical consent,  Pre-op evaluation,  At surgeon's request and post-op pain management  Laterality: Left and Lower  Prep: chloraprep       Needles:  Injection technique: Single-shot      Needle Length: 9cm  Needle Gauge: 22     Additional Needles: Arrow StimuQuik ECHO Echogenic Stimulating PNB Needle  Procedures:,,,, ultrasound used (permanent image in chart),,    Narrative:  Start time: 01/26/2022 8:07 AM End time: 01/26/2022 8:12 AM Injection made incrementally with aspirations every 5 mL.  Performed by: Personally  Anesthesiologist: Oleta Mouse, MD

## 2022-01-26 NOTE — Anesthesia Preprocedure Evaluation (Signed)
Anesthesia Evaluation  Patient identified by MRN, date of birth, ID band Patient awake    Reviewed: Allergy & Precautions, NPO status , Patient's Chart, lab work & pertinent test results  History of Anesthesia Complications Negative for: history of anesthetic complications  Airway Mallampati: II  TM Distance: >3 FB Neck ROM: Full    Dental  (+) Missing, Dental Advisory Given,    Pulmonary neg shortness of breath, neg sleep apnea, neg COPD, neg recent URI, Current Smoker and Patient abstained from smoking.   breath sounds clear to auscultation       Cardiovascular negative cardio ROS  Rhythm:Regular     Neuro/Psych negative neurological ROS  negative psych ROS   GI/Hepatic negative GI ROS, Neg liver ROS,,,  Endo/Other  negative endocrine ROS    Renal/GU negative Renal ROS     Musculoskeletal Left ankle fracture dislocaiton   Abdominal   Peds  Hematology negative hematology ROS (+)   Anesthesia Other Findings   Reproductive/Obstetrics                             Anesthesia Physical Anesthesia Plan  ASA: 2  Anesthesia Plan: MAC and Regional   Post-op Pain Management: Regional block*   Induction: Intravenous  PONV Risk Score and Plan: 1 and Propofol infusion and Treatment may vary due to age or medical condition  Airway Management Planned: Nasal Cannula and Natural Airway  Additional Equipment: None  Intra-op Plan:   Post-operative Plan:   Informed Consent: I have reviewed the patients History and Physical, chart, labs and discussed the procedure including the risks, benefits and alternatives for the proposed anesthesia with the patient or authorized representative who has indicated his/her understanding and acceptance.     Dental advisory given and Interpreter used for interveiw  Plan Discussed with: CRNA  Anesthesia Plan Comments:        Anesthesia Quick  Evaluation

## 2022-01-26 NOTE — Interval H&P Note (Signed)
History and Physical Interval Note:  01/26/2022 7:54 AM  Reginald Velez Marlowe Sax  has presented today for surgery, with the diagnosis of Left ankle fracture dislocaiton.  The various methods of treatment have been discussed with the patient and family. After consideration of risks, benefits and other options for treatment, the patient has consented to  Procedure(s): OPEN REDUCTION INTERNAL FIXATION (ORIF) ANKLE FRACTURE (Left) as a surgical intervention.  The patient's history has been reviewed, patient examined, no change in status, stable for surgery.  I have reviewed the patient's chart and labs.  Questions were answered to the patient's satisfaction.     Lennette Bihari P Maurene Hollin

## 2022-01-26 NOTE — Discharge Instructions (Addendum)
Katha Hamming, MD Patrecia Pace PA-C [facility] [place] [phone] (tel)   [phone] (fax)                                  POST-OPERATIVE INSTRUCTIONS   PLEASE BRING THE BLACK CAM BOOT THAT WAS PROVIDED TO YOU TO YOUR FIRST POST OP APPOINTMENT!  WEIGHT BEARING STATUS: non-weightbearing left lower extremity  RANGE OF MOTION/ACTIVITY: Ok for knee motion as tolerated. Keep splint in place  WOUND CARE Please keep splint clean dry and intact until follow-up. If your splint gets wet for any reason please contact the office immediately.  Do not stick anything down your splint such as pencils, momey, hangers to try and scratch yourself.  If you feel itchy take Benadryl as prescribed on the bottle for itching You may shower on Post-Op Day #2.  You must keep splint dry during this process and may find that a plastic bag taped around the extremity or alternatively a towel based bath may be a better option.   If you get your splint wet or if it is damaged please contact our clinic.  EXERCISES Due to your splint being in place you will not be able to bear weight through your extremity.   DO NOT PUT ANY WEIGHT ON YOUR OPERATIVE LEG Please use crutches or a walker to avoid weight bearing.   DVT/PE prophylaxis: Aspirin 325 mg daily x 30 days  DIET: As you were eating previously.  Can use over the counter stool softeners and bowel preparations, such as Miralax, to help with bowel movements.  Narcotics can be constipating.  Be sure to drink plenty of fluids  REGIONAL ANESTHESIA (NERVE BLOCKS) The anesthesia team may have performed a nerve block for you if safe in the setting of your care.  This is a great tool used to minimize pain.  Typically the block may start wearing off overnight but the long acting medicine may last for 3-4 days.  The nerve block wearing off can be a challenging period but please utilize your as needed pain medications to try and manage  this period.    POST-OP MEDICATIONS- Multimodal approach to pain control  In general your pain will be controlled with a combination of substances.  Prescriptions unless otherwise discussed are electronically sent to your pharmacy.  This is a carefully made plan we use to minimize narcotic use.     - Advil - Anti-inflammatory medication taken on a scheduled basis  - Acetaminophen - Non-narcotic pain medicine taken on a scheduled basis   - Oxycodone - This is a strong narcotic, to be used only on an "as needed" basis for pain.  -  Aspirin 325mg  - This medicine is used to minimize the risk of blood clots after surgery.           FOLLOW-UP If you develop a Fever (>101.5), Redness or Drainage from the surgical incision site, please call our office to arrange for an evaluation. Please call the office to schedule a follow-up appointment for your incision check if you do not already have one, 7-10 days post-operatively.   VISIT OUR WEBSITE FOR ADDITIONAL INFORMATION: [email or web address]   HELPFUL INFORMATION  If you had a block, it will wear off between 8-24 hrs postop typically.  This is period when your pain may go from nearly zero to the pain you would have had postop without the block.  This is an abrupt transition but nothing dangerous is happening.  You may take an extra dose of narcotic when this happens.  You should wean off your narcotic medicines as soon as you are able.  Most patients will be off or using minimal narcotics before their first postop appointment.   We suggest you use the pain medication the first night prior to going to bed, in order to ease any pain when the anesthesia wears off. You should avoid taking pain medications on an empty stomach as it will make you nauseous.  Do not drink alcoholic beverages or take illicit drugs when taking pain medications.  In most states it is against the law to drive while you are in a splint or sling.  And certainly against the law to  drive while taking narcotics.  You may return to work/school in the next couple of days when you feel up to it.   Pain medication may make you constipated.  Below are a few solutions to try in this order: Decrease the amount of pain medication if you aren't having pain. Drink lots of decaffeinated fluids. Drink prune juice and/or each dried prunes  If the first 3 don't work start with additional solutions Take Colace - an over-the-counter stool softener Take Senokot - an over-the-counter laxative Take Miralax - a stronger over-the-counter laxative

## 2022-01-26 NOTE — Progress Notes (Signed)
Orthopedic Tech Progress Note Patient Details:  Reginald Velez 01/24/1980 [id number]  Told patient to bring with him on follow up appointment per PA   Ortho Devices Type of Ortho Device: CAM walker Ortho Device/Splint Location: LLE Ortho Device/Splint Interventions: Ordered   Post Interventions Patient Tolerated: Well Instructions Provided: Care of device  [name] 01/26/2022, 10:57 AM

## 2022-01-26 NOTE — Transfer of Care (Signed)
Immediate Anesthesia Transfer of Care Note  Patient: Reginald Velez  Procedure(s) Performed: OPEN REDUCTION INTERNAL FIXATION (ORIF) ANKLE FRACTURE (Left: Ankle)  Patient Location: PACU  Anesthesia Type:General  Level of Consciousness: awake, alert , patient cooperative, and responds to stimulation  Airway & Oxygen Therapy: Patient Spontanous Breathing and Patient connected to nasal cannula oxygen  Post-op Assessment: Report given to RN and Post -op Vital signs reviewed and stable  Post vital signs: Reviewed and stable  Last Vitals:  Vitals Value Taken Time  BP 111/81 01/26/22 1037  Temp    Pulse 103 01/26/22 1039  Resp 13 01/26/22 1039  SpO2 100 % 01/26/22 1039  Vitals shown include unvalidated device data.  Last Pain:  Vitals:   01/26/22 0718  TempSrc:   PainSc: 0-No pain         Complications: No notable events documented.

## 2022-01-28 NOTE — Anesthesia Postprocedure Evaluation (Signed)
Anesthesia Post Note  Patient: Reginald Velez Marlowe Sax  Procedure(s) Performed: OPEN REDUCTION INTERNAL FIXATION (ORIF) ANKLE FRACTURE (Left: Ankle)     Patient location during evaluation: PACU Anesthesia Type: Regional and General Level of consciousness: awake and alert Pain management: pain level controlled Vital Signs Assessment: post-procedure vital signs reviewed and stable Respiratory status: spontaneous breathing, nonlabored ventilation and respiratory function stable Cardiovascular status: stable and blood pressure returned to baseline Postop Assessment: no apparent nausea or vomiting Anesthetic complications: no  No notable events documented.  Last Vitals:  Vitals:   01/26/22 1100 01/26/22 1115  BP: 111/69 105/71  Pulse: 89 81  Resp: 19 20  Temp:  36.8 C  SpO2: 100% 100%    Last Pain:  Vitals:   01/26/22 1115  TempSrc:   PainSc: 0-No pain                 Donnajean Chesnut

## 2022-02-01 ENCOUNTER — Encounter (HOSPITAL_COMMUNITY): Payer: Self-pay | Admitting: Student
